# Patient Record
Sex: Male | Born: 1965 | Race: Black or African American | Hispanic: No | Marital: Married
Health system: Southern US, Community
[De-identification: ages and names within clinical notes are randomized; demographics above are authoritative.]

---

## 2014-11-27 ENCOUNTER — Emergency Department (HOSPITAL_COMMUNITY): Payer: Self-pay

## 2014-11-27 ENCOUNTER — Emergency Department (HOSPITAL_COMMUNITY)
Admission: EM | Admit: 2014-11-27 | Discharge: 2014-11-27 | Disposition: A | Discharge status: Home / Self Care | Payer: Self-pay | Attending: Emergency Medicine | Admitting: Emergency Medicine

## 2014-11-27 ENCOUNTER — Encounter (HOSPITAL_COMMUNITY): Payer: Self-pay | Admitting: Emergency Medicine

## 2014-11-27 DIAGNOSIS — Z7952 Long term (current) use of systemic steroids: Secondary | ICD-10-CM | POA: Insufficient documentation

## 2014-11-27 DIAGNOSIS — Z79899 Other long term (current) drug therapy: Secondary | ICD-10-CM | POA: Insufficient documentation

## 2014-11-27 DIAGNOSIS — R509 Fever, unspecified: Secondary | ICD-10-CM | POA: Insufficient documentation

## 2014-11-27 DIAGNOSIS — D860 Sarcoidosis of lung: Secondary | ICD-10-CM | POA: Insufficient documentation

## 2014-11-27 DIAGNOSIS — R0600 Dyspnea, unspecified: Secondary | ICD-10-CM | POA: Insufficient documentation

## 2014-11-27 LAB — TROPONIN I: Troponin I: <0.03 ng/mL (ref <0.031)

## 2014-11-27 LAB — BASIC METABOLIC PANEL
Anion gap: 10 (ref 5–15)
BUN: 7 mg/dL (ref 6–23)
CO2: 21 mmol/L (ref 19–32)
Calcium: 9 mg/dL (ref 8.4–10.5)
Chloride: 106 mEq/L (ref 96–112)
Creatinine, Ser: 0.72 mg/dL (ref 0.50–1.35)
GFR calc Af Amer: >90 mL/min (ref >90)
GFR calc non Af Amer: >90 mL/min (ref >90)
Glucose, Bld: 143 mg/dL — ABNORMAL HIGH (ref 70–99)
Potassium: 3.3 mmol/L — ABNORMAL LOW (ref 3.5–5.1)
Sodium: 137 mmol/L (ref 135–145)

## 2014-11-27 LAB — CBC WITH DIFFERENTIAL/PLATELET
Basophils Absolute: 0 10*3/uL (ref 0.0–0.1)
Basophils Relative: 0 % (ref 0–1)
Eosinophils Absolute: 0 10*3/uL (ref 0.0–0.7)
Eosinophils Relative: 0 % (ref 0–5)
HCT: 45.1 % (ref 39.0–52.0)
Hemoglobin: 16.5 g/dL (ref 13.0–17.0)
Lymphocytes Relative: 11 % — ABNORMAL LOW (ref 12–46)
Lymphs Abs: 0.5 10*3/uL — ABNORMAL LOW (ref 0.7–4.0)
MCH: 31 pg (ref 26.0–34.0)
MCHC: 36.6 g/dL — ABNORMAL HIGH (ref 30.0–36.0)
MCV: 84.8 fL (ref 78.0–100.0)
Monocytes Absolute: 0.3 10*3/uL (ref 0.1–1.0)
Monocytes Relative: 7 % (ref 3–12)
Neutro Abs: 3.7 10*3/uL (ref 1.7–7.7)
Neutrophils Relative %: 83 % — ABNORMAL HIGH (ref 43–77)
Platelets: 145 10*3/uL — ABNORMAL LOW (ref 150–400)
RBC: 5.32 MIL/uL (ref 4.22–5.81)
RDW: 13.1 % (ref 11.5–15.5)
WBC: 4.4 10*3/uL (ref 4.0–10.5)

## 2014-11-27 LAB — BRAIN NATRIURETIC PEPTIDE: B Natriuretic Peptide: 135.7 pg/mL — ABNORMAL HIGH (ref 0.0–100.0)

## 2014-11-27 LAB — URINALYSIS, ROUTINE W REFLEX MICROSCOPIC
Bilirubin Urine: NEGATIVE
Glucose, UA: NEGATIVE mg/dL
Hgb urine dipstick: NEGATIVE
Ketones, ur: 40 mg/dL — AB
Leukocytes, UA: NEGATIVE
Nitrite: NEGATIVE
Protein, ur: NEGATIVE mg/dL
Specific Gravity, Urine: >1.046 — ABNORMAL HIGH (ref 1.005–1.030)
Urobilinogen, UA: 1 mg/dL (ref 0.0–1.0)
pH: 6.5 (ref 5.0–8.0)

## 2014-11-27 MED ORDER — IOHEXOL 350 MG/ML SOLN
100.0000 mL | Freq: Once | INTRAVENOUS | Status: AC | PRN
  Administered 2014-11-27: 100 mL via INTRAVENOUS

## 2014-11-27 MED ORDER — PREDNISONE 20 MG PO TABS
40.0000 mg | ORAL_TABLET | Freq: Once | ORAL | Status: AC
  Administered 2014-11-27: 40 mg via ORAL
  Filled 2014-11-27: qty 2

## 2014-11-27 MED ORDER — LORAZEPAM 2 MG/ML IJ SOLN
1.0000 mg | Freq: Once | INTRAMUSCULAR | Status: AC
  Administered 2014-11-27: 1 mg via INTRAVENOUS
  Filled 2014-11-27: qty 1

## 2014-11-27 MED ORDER — KETOROLAC TROMETHAMINE 30 MG/ML IJ SOLN
15.0000 mg | Freq: Once | INTRAMUSCULAR | Status: AC
Start: 2014-11-27 — End: 2014-11-27
  Administered 2014-11-27: 15 mg via INTRAVENOUS
  Filled 2014-11-27: qty 1

## 2014-11-27 MED ORDER — ONDANSETRON HCL 4 MG/2ML IJ SOLN
4.0000 mg | Freq: Once | INTRAMUSCULAR | Status: AC
Start: 2014-11-27 — End: 2014-11-27
  Administered 2014-11-27: 4 mg via INTRAVENOUS
  Filled 2014-11-27: qty 2

## 2014-11-27 NOTE — ED Notes (Signed)
He states he feels better; and that he has been napping.  I get him ice water per his request.

## 2014-11-27 NOTE — ED Notes (Signed)
Pt has a urinal at the bedside and is aware a urinalysis has been ordered.

## 2014-11-27 NOTE — Discharge Instructions (Signed)
Sarcoidosis, Schaumann's Disease, Sarcoid of [place] Sarcoidosis appears briefly and heals naturally in 60 to 70 percent of cases, often without the patient knowing or doing anything about it. 20 to 30 percent of patients with sarcoidosis are left with some permanent lung damage. In 10 to 15 percent of the patients, sarcoidosis can become chronic (long lasting). When either the granulomas or fibrosis seriously affect the function of a vital organ (lungs, heart, nervous system, liver, or kidneys), sarcoidosis can be fatal. This occurs 5 to 10 percent of the time. No one can predict how sarcoidosis will progress in an individual patient. The symptoms the patient experiences, the caregiver's findings, and the patient's race can give some clues. Sarcoidosis was once considered a rare disease. We now know that it is a common chronic illness that appears all over the world. It is the most common of the fibrotic (scarring) lung disorders. Anyone can get sarcoidosis. It occurs in all races and in both sexes. The risk is greater if you are a young black adult, especially a black woman, or are of [place], [place], [place], or [place] origin. In sarcoidosis, small lumps (also called nodules or granulomas) develop in multiple organs of the body. These granulomas are small collections of inflamed cells. They commonly appear in the lungs. This is the most common organ affected. They also occur in the lymph nodes (your glands), skin, liver, and eyes. The granulomas vary in the amount of disease they produce from very little with no problems (symptoms) to causing severe illness. The cause of sarcoidosis is not known. It may be due to an abnormal immune reaction in the body. Most people will recover. A few people will develop long lasting conditions that may get worse. Women are affected more often than men. The majority of those affected are under forty years of age. Because we do not know the cause, we do not have ways to  prevent it. SYMPTOMS   Fever.  Loss of appetite.  Night sweats.  Joint pain.  Aching muscles Symptoms vary because the disease affects different parts of the body in different people. Most people who see their caregiver with sarcoidosis have lung problems. The first signs are usually a dry cough and shortness of breath. There may also be wheezing, chest pain, or a cough that brings up bloody mucus. In severe cases, lung function may become so poor that the person cannot perform even the simple routine tasks of daily life. Other symptoms of sarcoidosis are less common than lung symptoms. They can include:  Skin symptoms. Sarcoidosis can appear as a collection of tender, red bumps called erythema nodosum. These bumps usually occur on the face, shins, and arms. They can also occur as a scaly, purplish discoloration on the nose, cheeks, and ears. This is called lupus pernio. Less often, sarcoidosis causes cysts, pimples, or disfiguring over growths of skin. In many cases, the disfiguring over growths develop in areas of scars or tattoos.  Eye symptoms. These include redness, eye pain, and sensitivity to light.  Heart symptoms. These include irregular heartbeat and heart failure.  Other symptoms. A person may have paralyzed facial muscles, seizures, psychiatric symptoms, swollen salivary glands, or bone pain. DIAGNOSIS  Even when there are no symptoms, your caregiver can sometimes pick up signs of sarcoidosis during a routine examination, usually through a chest x-ray or when checking other complaints. The patient's age and race or ethnic group can raise an additional red flag that a sign or symptom could  be related to sarcoidosis.   Enlargement of the salivary or tear glands and cysts in bone tissue may also be caused by sarcoidosis.  You may have had a biopsy done that shows signs of sarcoidosis. A biopsy is a small tissue sample that is removed for laboratory testing. This tissue sample can  be taken from your lung, skin, lip, or another inflamed or abnormal area of the body.  You may have had an abnormal chest X-ray. Although you appear healthy, a chest X-ray ordered for other reasons may turn up abnormalities that suggest sarcoidosis.  Other tests may be needed. These tests may be done to rule out other illnesses or to determine the amount of organ damage caused by sarcoidosis. Some of the most common tests are:  Blood levels of calcium or angiotensin-converting enzyme may be high in people with sarcoidosis.  Blood tests to evaluate how well your liver is functioning.  Lung function tests to measure how well you are breathing.  A complete eye examination. TREATMENT  If sarcoidosis does not cause any problems, treatment may not be necessary. Your caregiver may decide to simply monitor your condition. As part of this monitoring process, you may have frequent office visits, follow-up chest X-rays, and tests of your lung function.If you have signs of moderate or severe lung disease, your doctor may recommend:  A corticosteroid drug, such as prednisone (sold under several brand names).  Corticosteroids also are used to treat sarcoidosis of the eyes, joints, skin, nerves, or heart.  Corticosteroid eye drops may be used for the eyes.  Over-the-counter medications like nonsteroidal anti-inflammatory drugs (NSAID) often are used to treat joint pain first before corticosteroids, which tend to have more side effects.  If corticosteroids are not effective or cause serious side effects, other drugs that alter or suppress the immune system may be used.  In rare cases, when sarcoidosis causes life-threatening lung disease, a lung transplant may be necessary. However, there is some risk that the new lungs also will be attacked by sarcoidosis. SEEK IMMEDIATE MEDICAL CARE IF:   You suffer from shortness of breath or a lingering cough.  You develop new problems that may be related to the  disease. Remember this disease can affect almost all organs of the body and cause many different problems. Document Released: 09/11/2004 Document Revised: 02/03/2012 Document Reviewed: 03/09/2014 [facility] Patient Information 2015 [place], [place]. This information is not intended to replace advice given to you by your health care provider. Make sure you discuss any questions you have with your health care provider.  Shortness of Breath Shortness of breath means you have trouble breathing. It could also mean that you have a medical problem. You should get immediate medical care for shortness of breath. CAUSES   Not enough oxygen in the air such as with high altitudes or a smoke-filled room.  Certain lung diseases, infections, or problems.  Heart disease or conditions, such as angina or heart failure.  Low red blood cells (anemia).  Poor physical fitness, which can cause shortness of breath when you exercise.  Chest or back injuries or stiffness.  Being overweight.  Smoking.  Anxiety, which can make you feel like you are not getting enough air. DIAGNOSIS  Serious medical problems can often be found during your physical exam. Tests may also be done to determine why you are having shortness of breath. Tests may include:  Chest X-rays.  Lung function tests.  Blood tests.  An electrocardiogram (ECG).  An ambulatory electrocardiogram. An  ambulatory ECG records your heartbeat patterns over a 24-hour period.  Exercise testing.  A transthoracic echocardiogram (TTE). During echocardiography, sound waves are used to evaluate how blood flows through your heart.  A transesophageal echocardiogram (TEE).  Imaging scans. Your health care provider may not be able to find a cause for your shortness of breath after your exam. In this case, it is important to have a follow-up exam with your health care provider as directed.  TREATMENT  Treatment for shortness of breath depends on the cause  of your symptoms and can vary greatly. HOME CARE INSTRUCTIONS   Do not smoke. Smoking is a common cause of shortness of breath. If you smoke, ask for help to quit.  Avoid being around chemicals or things that may bother your breathing, such as paint fumes and dust.  Rest as needed. Slowly resume your usual activities.  If medicines were prescribed, take them as directed for the full length of time directed. This includes oxygen and any inhaled medicines.  Keep all follow-up appointments as directed by your health care provider. SEEK MEDICAL CARE IF:   Your condition does not improve in the time expected.  You have a hard time doing your normal activities even with rest.  You have any new symptoms. SEEK IMMEDIATE MEDICAL CARE IF:   Your shortness of breath gets worse.  You feel light-headed, faint, or develop a cough not controlled with medicines.  You start coughing up blood.  You have pain with breathing.  You have chest pain or pain in your arms, shoulders, or abdomen.  You have a fever.  You are unable to walk up stairs or exercise the way you normally do. MAKE SURE YOU:  Understand these instructions.  Will watch your condition.  Will get help right away if you are not doing well or get worse. Document Released: 08/06/2001 Document Revised: 11/16/2013 Document Reviewed: 01/27/2012 [facility] Patient Information 2015 [place], [place]. This information is not intended to replace advice given to you by your health care provider. Make sure you discuss any questions you have with your health care provider.

## 2014-11-27 NOTE — ED Notes (Signed)
Pt states that since 2pm yesterday he has been alternating chills and sweats roughly every hour.  Pt unsure if he has had a fever.

## 2014-11-27 NOTE — ED Provider Notes (Signed)
CSN: [id number]     Arrival date & time 11/27/14  7096 History   First MD Initiated Contact with Patient 11/27/14 [phone]     Chief Complaint  Patient presents with  . Chills  . Night Sweats     (Consider location/radiation/quality/duration/timing/severity/associated sxs/prior Treatment) HPI   48yM with alternating subjective fever and chills. Onset yesterday. Feels achy all over. Mild SOB. No unusual swelling. Nausea. No vomiting. No hx of underlying lung disease that he knows of. Has hx of sarcoid but only skin manifestations that he is aware of. No weight loss. No cough. No sick contacts. Visiting from [place] and drove up yesterday. Symptoms began right as he was getting on the road. Was feeling fine just prior.   History reviewed. No pertinent past medical history. History reviewed. No pertinent past surgical history. No family history on file. History  Substance Use Topics  . Smoking status: Not on file  . Smokeless tobacco: Not on file  . Alcohol Use: No    Review of Systems  All systems reviewed and negative, other than as noted in HPI.   Allergies  Review of patient's allergies indicates no known allergies.  Home Medications   Prior to Admission medications   Medication Sig Start Date End Date Taking? Authorizing Provider  omeprazole (PRILOSEC) 40 MG capsule Take 40 mg by mouth daily.   Yes Historical Provider, MD  predniSONE (DELTASONE) 10 MG tablet Take 10 mg by mouth daily with breakfast.  daily x 2 weeks, then  daily x 2 weeks, then  daily x 2 weeks, then  daily 08/18/14  Yes Historical Provider, MD   BP 117/70 mmHg  Pulse 68  Temp(Src) 98.1 F (36.7 C) (Oral)  Resp 15  SpO2 100% Physical Exam  Constitutional: He appears well-developed and well-nourished. No distress.  HENT:  Head: Normocephalic and atraumatic.  Eyes: Conjunctivae are normal. Right eye exhibits no discharge. Left eye exhibits no discharge.  Neck: Neck supple.   Cardiovascular: Normal rate, regular rhythm and normal heart sounds.  Exam reveals no gallop and no friction rub.   No murmur heard. Pulmonary/Chest: Effort normal and breath sounds normal. No respiratory distress. He has no wheezes. He has no rales.  Abdominal: Soft. He exhibits no distension. There is no tenderness.  Musculoskeletal: He exhibits no edema or tenderness.  Lower extremities symmetric as compared to each other. No calf tenderness. Negative Homan's. No palpable cords.   Neurological: He is alert.  Skin: Skin is warm and dry.  Psychiatric: He has a normal mood and affect. His behavior is normal. Thought content normal.  Nursing note and vitals reviewed.   ED Course  Procedures (including critical care time) Labs Review Labs Reviewed  BASIC METABOLIC PANEL - Abnormal; Notable for the following:    Potassium 3.3 (*)    Glucose, Bld 143 (*)    All other components within normal limits  BRAIN NATRIURETIC PEPTIDE - Abnormal; Notable for the following:    B Natriuretic Peptide 135.7 (*)    All other components within normal limits  CBC WITH DIFFERENTIAL - Abnormal; Notable for the following:    MCHC 36.6 (*)    Platelets 145 (*)    Neutrophils Relative % 83 (*)    Lymphocytes Relative 11 (*)    Lymphs Abs 0.5 (*)    All other components within normal limits  URINALYSIS, ROUTINE W REFLEX MICROSCOPIC - Abnormal; Notable for the following:    Specific Gravity, Urine >1.046 (*)  Ketones, ur 40 (*)    All other components within normal limits  TROPONIN I    Imaging Review Dg Chest 2 View  11/27/2014   CLINICAL DATA:  Chills and body shaking today.  EXAM: CHEST  2 VIEW  COMPARISON:  None.  FINDINGS: The heart size and mediastinal contours are within normal limits. There is probable minimal atelectasis of the left mid lung. There is no focal infiltrate, pulmonary edema, or pleural effusion. The visualized skeletal structures are unremarkable.  IMPRESSION: No focal  pneumonia.  Probable minimal atelectasis of left mid lung.   Electronically Signed   By: Sherian Rein M.D.   On: 11/27/2014 10:04   Ct Angio Chest Pe W/cm &/or Wo Cm  11/27/2014   CLINICAL DATA:  Initial encounter for alternating fever and chills since 2 p.m. yesterday  EXAM: CT ANGIOGRAPHY CHEST WITH CONTRAST  TECHNIQUE: Multidetector CT imaging of the chest was performed using the standard protocol during bolus administration of intravenous contrast. Multiplanar CT image reconstructions and MIPs were obtained to evaluate the vascular anatomy.  CONTRAST:  OMNIPAQUE IOHEXOL 350 MG/ML SOLN  COMPARISON:  None.  FINDINGS: Soft tissue / Mediastinum: There is no filling defect within the opacified pulmonary arteries to suggest the presence of an acute pulmonary embolus. No thoracic aortic aneurysm. There is no dissection of the thoracic aorta.  No axillary lymphadenopathy. 10 millimeter short axis subcarinal lymph node is at upper limits of normal for size. There is no mediastinal or hilar lymphadenopathy. The esophagus is unremarkable. Heart size is normal. No pericardial effusion.  Lungs / Pleura: Minimal biapical pleural parenchymal scarring is evident. Patient also has scattered areas of nodular and linear/bandlike subpleural opacities/scarring. Nodularity is seen along the central aspect of the major fissures bilaterally.  Bones: Bone windows reveal no worrisome lytic or sclerotic osseous lesions.  Upper Abdomen:  Unremarkable.  Review of the MIP images confirms the above findings.  IMPRESSION: 1. No CT evidence for acute pulmonary embolus. 2. Scattered parenchymal and pleural nodularity with some areas of bandlike subpleural opacity. [facility]where records from [facility] in [place] indicate a clinical history of sarcoidosis. Imaging findings on today's CT would be compatible with that diagnosis.   Electronically Signed   By: Kennith Center M.D.   On: 11/27/2014 11:50     EKG  Interpretation None      MDM   Final diagnoses:  Fever and chills  Dyspnea  Pulmonary sarcoidosis    48yM with dyspnea, subjective fever/chills. Previous manifestations only skin lesions. CT w/o evidence of focal infiltrate or PE. Afebrile. HD stable. Hx of sarcoidosis. Very atypical for ACS. Could be symptoms of sarcoid although pt adamant "that this isn't my sarcoid. I've had it for 15 years and never had symptoms like this."    Raeford Razor, MD 11/30/14 [phone]

## 2016-03-14 IMAGING — CT CT ANGIO CHEST
1 of 4 series · 19 of 32 positions shown · IV contrast (OMNIPAQUE 300)
Comparison: None.

CLINICAL DATA: Initial encounter for alternating fever and chills
since 2 p.m. yesterday

EXAM:
CT ANGIOGRAPHY CHEST WITH CONTRAST
TECHNIQUE: Multidetector CT imaging of the chest was performed using the
standard protocol during bolus administration of intravenous
contrast. Multiplanar CT image reconstructions and MIPs were
obtained to evaluate the vascular anatomy.
CONTRAST:  100mL OMNIPAQUE IOHEXOL 350 MG/ML SOLN

[Series 11: thins for pacs · axial · 0.74mm/px · z∈[-293,-51]mm · 19 of 268 slices shown]
[im 13/268  lung]
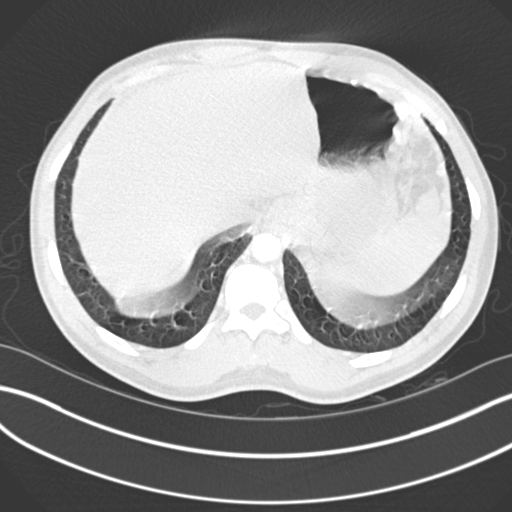
[im 25/268  soft-tissue]
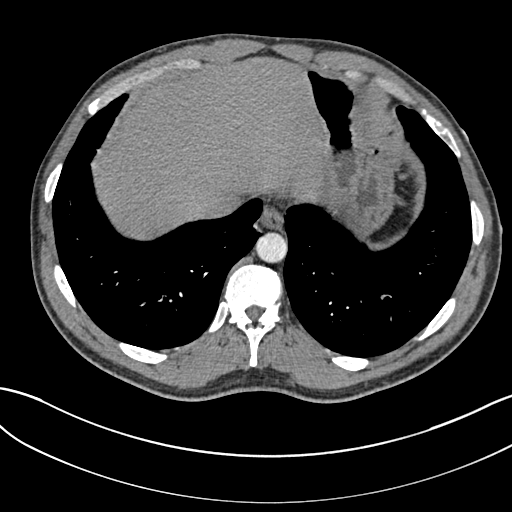
[im 37/268  lung]
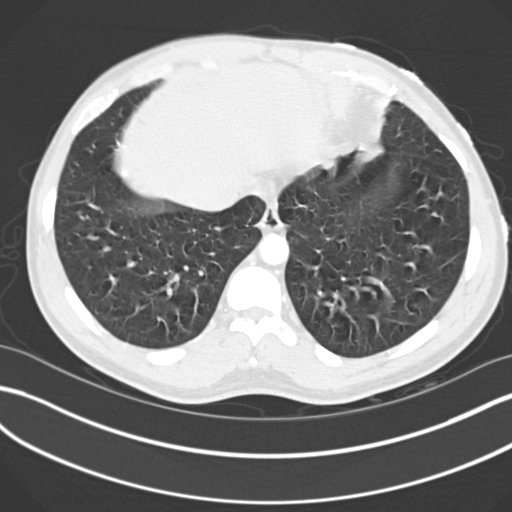
[im 49/268  soft-tissue]
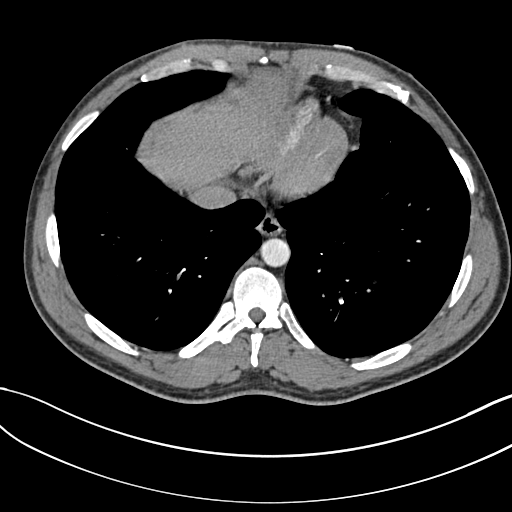
[im 61/268  lung]
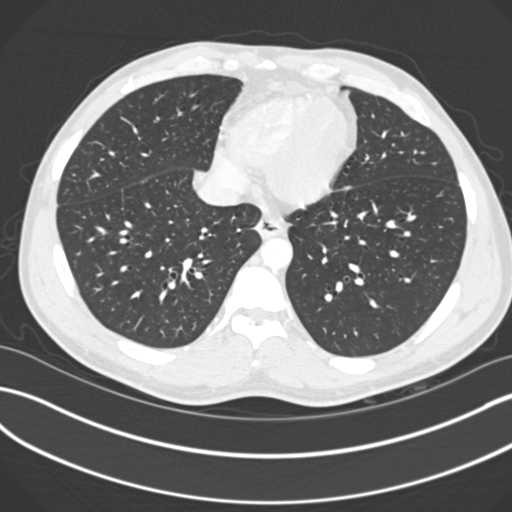
[im 85/268  soft-tissue]
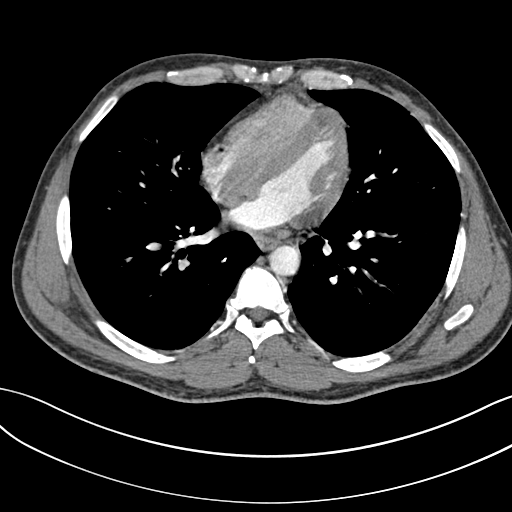
[im 98/268  lung]
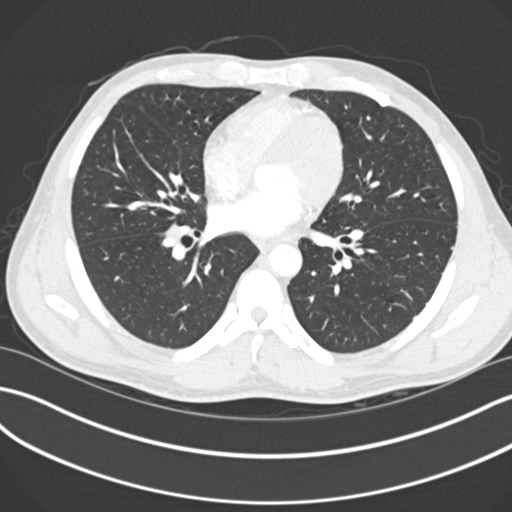
[im 110/268  soft-tissue]
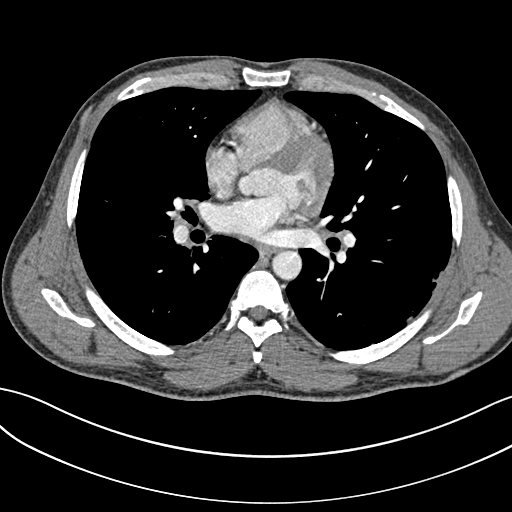
[im 122/268  lung]
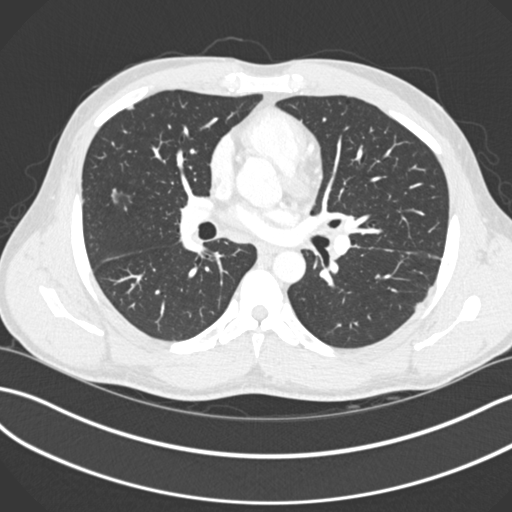
[im 134/268  soft-tissue]
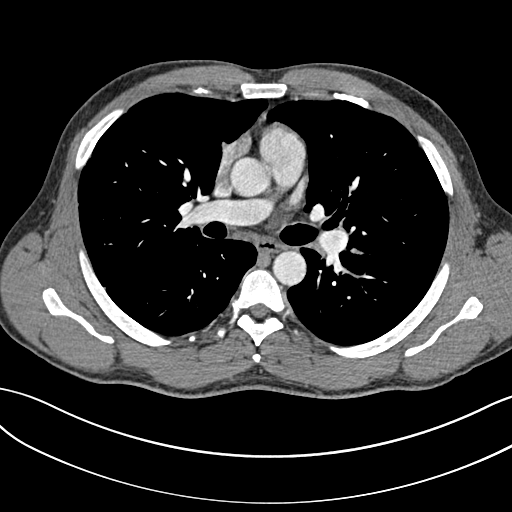
[im 146/268  lung]
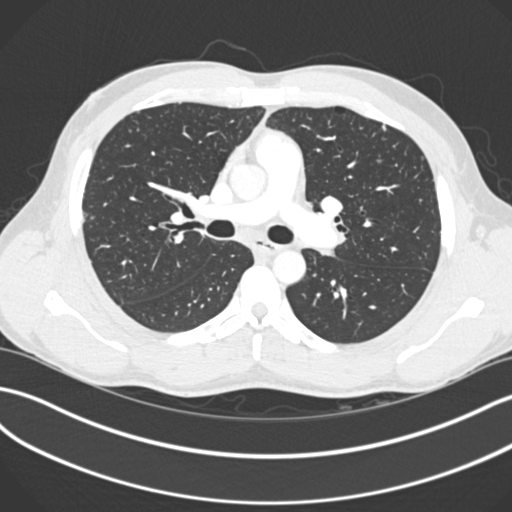
[im 158/268  soft-tissue]
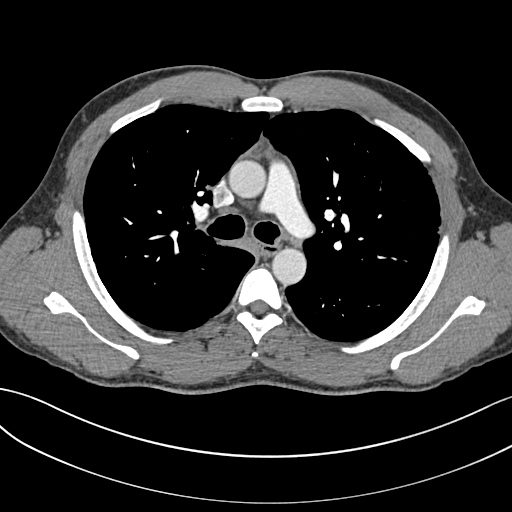
[im 170/268  lung]
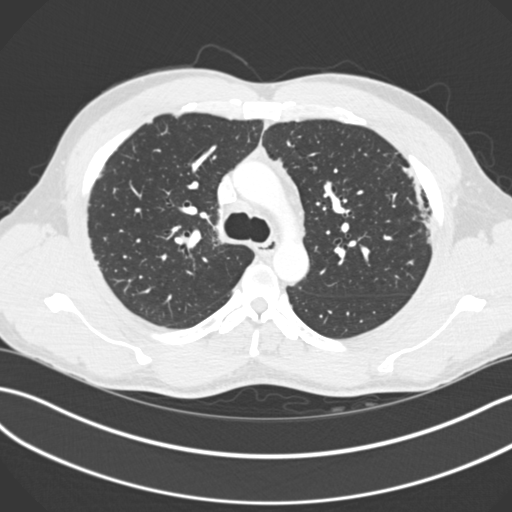
[im 183/268  soft-tissue]
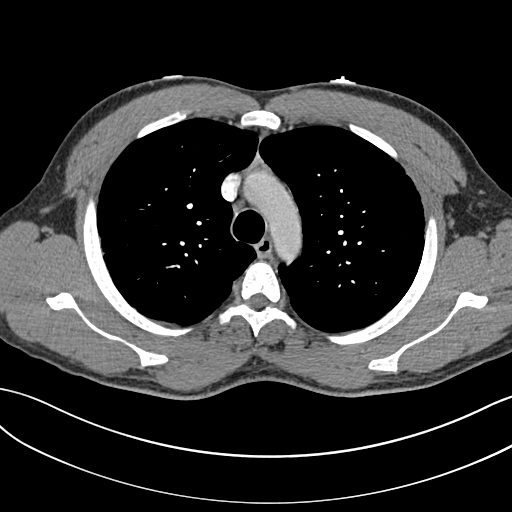
[im 207/268  lung]
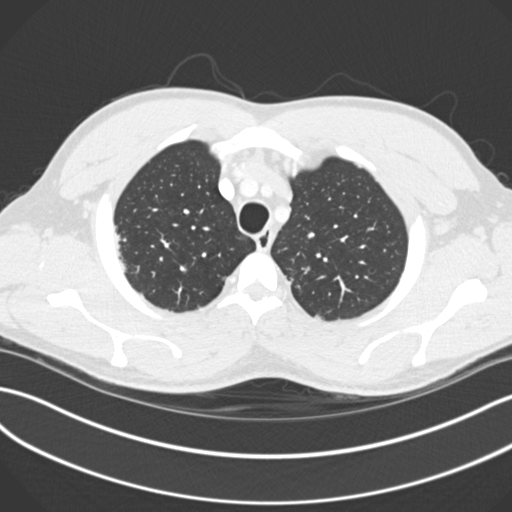
[im 219/268  soft-tissue]
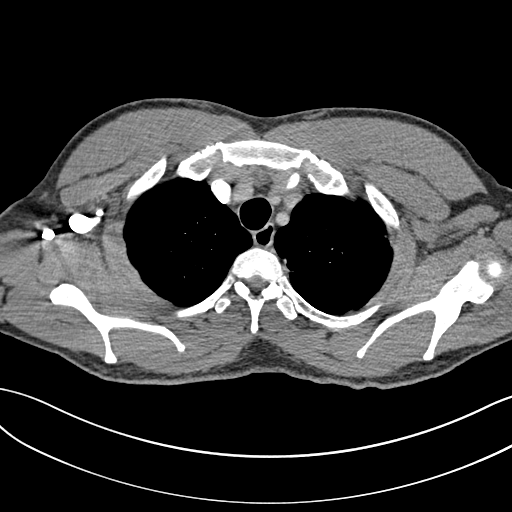
[im 231/268  lung]
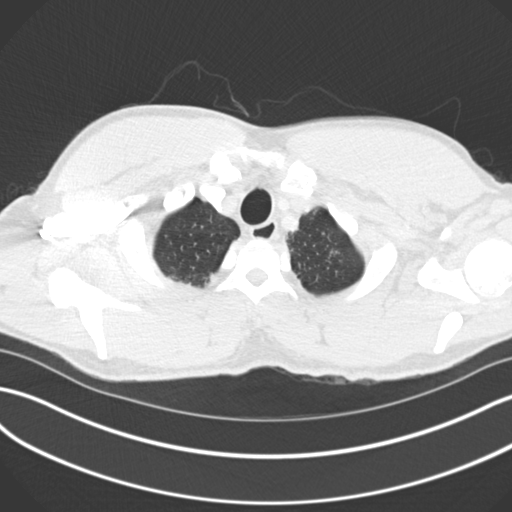
[im 243/268  soft-tissue]
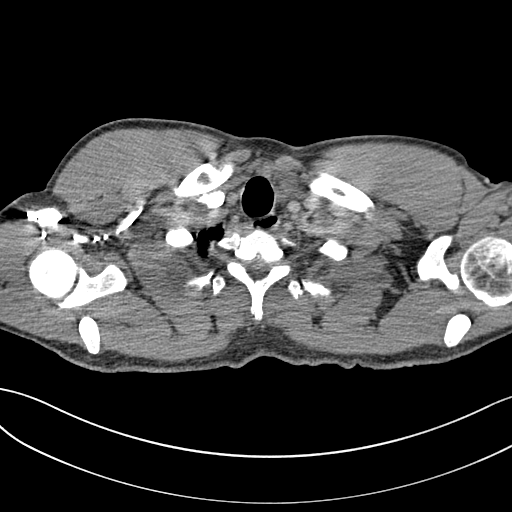
[im 255/268  lung]
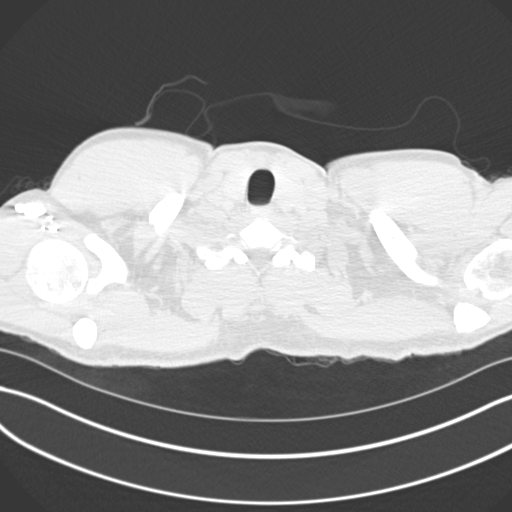

[19 of 32 positions shown; findings below may reference images not displayed]

FINDINGS: Soft tissue / Mediastinum: There is no filling defect within the
opacified pulmonary arteries to suggest the presence of an acute
pulmonary embolus. No thoracic aortic aneurysm. There is no
dissection of the thoracic aorta.

No axillary lymphadenopathy. 10 millimeter short axis subcarinal
lymph node is at upper limits of normal for size. There is no
mediastinal or hilar lymphadenopathy. The esophagus is unremarkable.
Heart size is normal. No pericardial effusion.

Lungs / Pleura: Minimal biapical pleural parenchymal scarring is
evident. Patient also has scattered areas of nodular and
linear/bandlike subpleural opacities/scarring. Nodularity is seen
along the central aspect of the major fissures bilaterally.

Bones: Bone windows reveal no worrisome lytic or sclerotic osseous
lesions.

Upper Abdomen:  Unremarkable.

Review of the MIP images confirms the above findings.
IMPRESSION: 1. No CT evidence for acute pulmonary embolus.
2. Scattered parenchymal and pleural nodularity with some areas of
bandlike subpleural opacity. [REDACTED] [REDACTED] records from [facility] in [HOSPITAL] indicate a clinical history of sarcoidosis.
Imaging findings on today's CT would be compatible with that
diagnosis.
